# Patient Record
Sex: Female | Born: 1935 | Race: White | Hispanic: No | Smoking: Never smoker
Health system: Southern US, Community
[De-identification: ages and names within clinical notes are randomized; demographics above are authoritative.]

## PROBLEM LIST (undated history)

## (undated) DIAGNOSIS — I10 Essential (primary) hypertension: Secondary | ICD-10-CM

## (undated) DIAGNOSIS — E039 Hypothyroidism, unspecified: Secondary | ICD-10-CM

## (undated) HISTORY — PX: ABDOMINAL HYSTERECTOMY: SHX81

---

## 2014-07-15 ENCOUNTER — Encounter (HOSPITAL_BASED_OUTPATIENT_CLINIC_OR_DEPARTMENT_OTHER): Payer: Self-pay | Admitting: Emergency Medicine

## 2014-07-15 ENCOUNTER — Emergency Department (HOSPITAL_BASED_OUTPATIENT_CLINIC_OR_DEPARTMENT_OTHER): Payer: Medicare Other

## 2014-07-15 ENCOUNTER — Emergency Department (HOSPITAL_BASED_OUTPATIENT_CLINIC_OR_DEPARTMENT_OTHER)
Admission: EM | Admit: 2014-07-15 | Discharge: 2014-07-15 | Disposition: A | Discharge status: Home / Self Care | Payer: Medicare Other | Attending: Emergency Medicine | Admitting: Emergency Medicine

## 2014-07-15 DIAGNOSIS — Z862 Personal history of diseases of the blood and blood-forming organs and certain disorders involving the immune mechanism: Secondary | ICD-10-CM | POA: Diagnosis not present

## 2014-07-15 DIAGNOSIS — S0993XA Unspecified injury of face, initial encounter: Secondary | ICD-10-CM | POA: Diagnosis not present

## 2014-07-15 DIAGNOSIS — I10 Essential (primary) hypertension: Secondary | ICD-10-CM | POA: Insufficient documentation

## 2014-07-15 DIAGNOSIS — Z8639 Personal history of other endocrine, nutritional and metabolic disease: Secondary | ICD-10-CM | POA: Diagnosis not present

## 2014-07-15 DIAGNOSIS — S298XXA Other specified injuries of thorax, initial encounter: Secondary | ICD-10-CM | POA: Diagnosis present

## 2014-07-15 DIAGNOSIS — S0990XA Unspecified injury of head, initial encounter: Secondary | ICD-10-CM | POA: Insufficient documentation

## 2014-07-15 DIAGNOSIS — T148XXA Other injury of unspecified body region, initial encounter: Secondary | ICD-10-CM

## 2014-07-15 DIAGNOSIS — Z23 Encounter for immunization: Secondary | ICD-10-CM | POA: Insufficient documentation

## 2014-07-15 DIAGNOSIS — Y9389 Activity, other specified: Secondary | ICD-10-CM | POA: Insufficient documentation

## 2014-07-15 DIAGNOSIS — Y92410 Unspecified street and highway as the place of occurrence of the external cause: Secondary | ICD-10-CM | POA: Diagnosis not present

## 2014-07-15 DIAGNOSIS — S81809A Unspecified open wound, unspecified lower leg, initial encounter: Secondary | ICD-10-CM

## 2014-07-15 DIAGNOSIS — S199XXA Unspecified injury of neck, initial encounter: Secondary | ICD-10-CM | POA: Diagnosis not present

## 2014-07-15 DIAGNOSIS — S81009A Unspecified open wound, unspecified knee, initial encounter: Secondary | ICD-10-CM | POA: Insufficient documentation

## 2014-07-15 DIAGNOSIS — R0789 Other chest pain: Secondary | ICD-10-CM

## 2014-07-15 DIAGNOSIS — S91009A Unspecified open wound, unspecified ankle, initial encounter: Secondary | ICD-10-CM

## 2014-07-15 HISTORY — DX: Hypothyroidism, unspecified: E03.9

## 2014-07-15 HISTORY — DX: Essential (primary) hypertension: I10

## 2014-07-15 MED ORDER — TRAMADOL HCL 50 MG PO TABS: 50.0000 mg | ORAL_TABLET | Freq: Four times a day (QID) | ORAL | Status: AC | PRN

## 2014-07-15 MED ORDER — TETANUS-DIPHTH-ACELL PERTUSSIS 5-2.5-18.5 LF-MCG/0.5 IM SUSP
0.5000 mL | Freq: Once | INTRAMUSCULAR | Status: AC
  Administered 2014-07-15: 0.5 mL via INTRAMUSCULAR
  Filled 2014-07-15: qty 0.5
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
## 2014-07-15 NOTE — ED Notes (Signed)
Pt directed to pharmacy to pick up prescriptions- family present at bedside to drive- wound care discussed- family (pt's granddaughter) verbalized understanding

## 2014-07-15 NOTE — Discharge Instructions (Signed)
Wound care as described. Followup with your family doctor or orthopedic doctor for care of the skin tears upon return to [place]. Expect to be sore and stiff the next 2 days. Take the tramadol as needed. Get seen for the development of any abdominal pain or persistent nausea and vomiting.
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
